# Patient Record
Sex: Male | Born: 2000
Health system: Southern US, Community
[De-identification: ages and names within clinical notes are randomized; demographics above are authoritative.]

## PROBLEM LIST (undated history)

## (undated) DIAGNOSIS — I498 Other specified cardiac arrhythmias: Secondary | ICD-10-CM

## (undated) HISTORY — DX: Other specified cardiac arrhythmias: I49.8

---
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
## 2021-02-21 ENCOUNTER — Encounter: Payer: Self-pay | Admitting: Emergency Medicine

## 2021-02-21 ENCOUNTER — Ambulatory Visit
Admission: EM | Admit: 2021-02-21 | Discharge: 2021-02-21 | Disposition: A | Discharge status: Home / Self Care | Payer: 59 | Attending: Sports Medicine | Admitting: Sports Medicine

## 2021-02-21 ENCOUNTER — Other Ambulatory Visit: Payer: Self-pay

## 2021-02-21 DIAGNOSIS — J029 Acute pharyngitis, unspecified: Secondary | ICD-10-CM | POA: Diagnosis not present

## 2021-02-21 DIAGNOSIS — K120 Recurrent oral aphthae: Secondary | ICD-10-CM | POA: Diagnosis not present

## 2021-02-21 LAB — GROUP A STREP BY PCR: Group A Strep by PCR: NOT DETECTED

## 2021-02-21 NOTE — Discharge Instructions (Signed)
Your strep test was negative. Your exam is consistent with an oral ulcer which should resolve in 1 to 2 weeks with just supportive care.  Please see the educational handout. If it does not resolve then you can see your primary care provider or come back here for consideration of medication.  Symptoms only been present 2 to 3 days a prescription medication is not indicated. Just continue with over-the-counter meds as needed.  Please read the educational handout. I gave you a work note saying you were seen today and go back to work tomorrow.

## 2021-02-21 NOTE — ED Provider Notes (Signed)
[facility]    CSN: [id number] Arrival date & time: 02/21/21  1316      History   Chief Complaint Chief Complaint  Patient presents with  . Sore Throat    HPI Duane Harris is a 20 y.o. male.   Patient pleasant 20 year old male who presents for evaluation the above issues.  He reports 2 to 3 days of a sore throat on the left side.  He is looked in the mirror and noted a white blister like appearance.  He has pain with swallowing.  He also has a mild headache.  He has been doing some salt water gargles but no over-the-counter meds, no throat lozenges.  Denies any URI symptoms.  No cough chest pain shortness of breath.  No abdominal or urinary symptoms.  He has not been vaccinated against COVID or influenza.  No COVID exposure or COVID history.  No red flag signs or symptoms appreciated on history.     History reviewed. No pertinent past medical history.  There are no problems to display for this patient.   History reviewed. No pertinent surgical history.     Home Medications    Prior to Admission medications   Medication Sig Start Date End Date Taking? Authorizing Provider  cyclobenzaprine (FLEXERIL) 5 MG tablet 1-2 tabs po q 8 hours prn 08/02/17   Payton Mccallum, MD  HYDROcodone-acetaminophen (NORCO/VICODIN) 5-325 MG tablet 1-2 tabs po bid prn 08/02/17   Payton Mccallum, MD    Family History History reviewed. No pertinent family history.  Social History Social History   Tobacco Use  . Smoking status: Never Smoker  . Smokeless tobacco: Never Used  Substance Use Topics  . Alcohol use: No  . Drug use: No     Allergies   Patient has no known allergies.   Review of Systems Review of Systems  Constitutional: Negative.  Negative for activity change, appetite change, chills, diaphoresis, fatigue and fever.  HENT: Positive for sore throat. Negative for congestion, ear discharge, ear pain, postnasal drip, rhinorrhea, sinus pressure and sinus pain.    Eyes: Negative.   Respiratory: Negative.  Negative for cough, chest tightness, shortness of breath, wheezing and stridor.   Cardiovascular: Negative.  Negative for chest pain and palpitations.  Gastrointestinal: Negative.  Negative for abdominal pain, constipation, diarrhea, nausea and vomiting.  Genitourinary: Negative.  Negative for dysuria.  Musculoskeletal: Negative.   Skin: Negative.  Negative for rash.  Neurological: Positive for headaches. Negative for dizziness, syncope, light-headedness and numbness.     Physical Exam Triage Vital Signs ED Triage Vitals  Enc Vitals Group     BP 02/21/21 1329 121/76     Pulse Rate 02/21/21 1329 78     Resp 02/21/21 1329 18     Temp 02/21/21 1329 98.2 F (36.8 C)     Temp Source 02/21/21 1329 Oral     SpO2 02/21/21 1329 100 %     Weight 02/21/21 1327 140 lb (63.5 kg)     Height 02/21/21 1327 6\' 1"  (1.854 m)     Head Circumference --      Peak Flow --      Pain Score 02/21/21 1327 4     Pain Loc --      Pain Edu? --      Excl. in GC? --    No data found.  Updated Vital Signs BP 121/76 (BP Location: Left Arm)   Pulse 78   Temp 98.2 F (36.8 C) (Oral)  Resp 18   Ht 6\' 1"  (1.854 m)   Wt 63.5 kg   SpO2 100%   BMI 18.47 kg/m   Visual Acuity Right Eye Distance:   Left Eye Distance:   Bilateral Distance:    Right Eye Near:   Left Eye Near:    Bilateral Near:     Physical Exam Vitals and nursing note reviewed.  Constitutional:      General: He is not in acute distress.    Appearance: He is well-developed. He is not ill-appearing, toxic-appearing or diaphoretic.  HENT:     Head: Normocephalic and atraumatic.     Right Ear: Tympanic membrane normal.     Left Ear: Tympanic membrane normal.     Nose: No congestion or rhinorrhea.     Mouth/Throat:     Mouth: Mucous membranes are moist. Oral lesions present.     Pharynx: Uvula midline. No pharyngeal swelling, oropharyngeal exudate, posterior oropharyngeal erythema or uvula  swelling.     Tonsils: No tonsillar exudate or tonsillar abscesses. 1+ on the right. 1+ on the left.  Eyes:     Extraocular Movements:     Right eye: Normal extraocular motion.     Left eye: Normal extraocular motion.     Conjunctiva/sclera: Conjunctivae normal.     Pupils: Pupils are equal, round, and reactive to light.  Neck:     Thyroid: No thyromegaly.  Cardiovascular:     Rate and Rhythm: Normal rate and regular rhythm.     Heart sounds: Normal heart sounds. No murmur heard. No friction rub. No gallop.   Pulmonary:     Effort: Pulmonary effort is normal. No respiratory distress.     Breath sounds: Normal breath sounds. No stridor. No wheezing, rhonchi or rales.  Abdominal:     General: Bowel sounds are normal.     Palpations: Abdomen is soft.     Tenderness: There is no abdominal tenderness. There is no guarding or rebound.  Musculoskeletal:     Cervical back: Normal range of motion and neck supple.  Lymphadenopathy:     Cervical: Cervical adenopathy present.  Skin:    General: Skin is warm and dry.     Capillary Refill: Capillary refill takes less than 2 seconds.     Coloration: Skin is not pale.     Findings: No erythema or rash.  Neurological:     General: No focal deficit present.     Mental Status: He is alert and oriented to person, place, and time.      UC Treatments / Results  Labs (all labs ordered are listed, but only abnormal results are displayed) Labs Reviewed  GROUP A STREP BY PCR    EKG   Radiology No results found.  Procedures Procedures (including critical care time)  Medications Ordered in UC Medications - No data to display  Initial Impression / Assessment and Plan / UC Course  I have reviewed the triage vital signs and the nursing notes.  Pertinent labs & imaging results that were available during my care of the patient were reviewed by me and considered in my medical decision making (see chart for details).  Clinical  impression: 2 to 3 days of left-sided sore throat with a white ulceration in the posterior oropharynx.  Treatment plan: 1.  The findings and treatment plan were discussed in detail with the patient.  Patient was in agreement. 2.  We will go ahead and get a strep test.  It was negative.  His exam is consistent with oral ulcer also called aphthous stomatitis. 3.  Educational handout provided. 4.  Over-the-counter meds as needed. 5.  Salt water gargles and throat lozenges. 6.  Get him a work note just saying he was seen today. 7.  Follow-up with pediatrician if symptoms do not resolve in 1 to 2 weeks for consideration of antiviral, antifungal, or antibiotic.  For now no prescription medication is indicated.    Final Clinical Impressions(s) / UC Diagnoses   Final diagnoses:  Aphthous stomatitis  Sore throat     Discharge Instructions     Your strep test was negative. Your exam is consistent with an oral ulcer which should resolve in 1 to 2 weeks with just supportive care.  Please see the educational handout. If it does not resolve then you can see your primary care provider or come back here for consideration of medication.  Symptoms only been present 2 to 3 days a prescription medication is not indicated. Just continue with over-the-counter meds as needed.  Please read the educational handout. I gave you a work note saying you were seen today and go back to work tomorrow.    ED Prescriptions    None     PDMP not reviewed this encounter.   Delton See, MD 02/21/21 1410

## 2021-02-21 NOTE — ED Triage Notes (Signed)
Patient c/o sore throat and a blister in his throat x 3 days. Patient also c/o headache.

## 2021-07-09 ENCOUNTER — Other Ambulatory Visit: Payer: Self-pay

## 2021-07-09 ENCOUNTER — Ambulatory Visit: Payer: Self-pay | Admitting: Physician Assistant

## 2021-07-09 ENCOUNTER — Encounter: Payer: Self-pay | Admitting: Physician Assistant

## 2021-07-09 VITALS — BP 134/77 | Temp 98.6°F | Resp 14 | Ht 73.0 in | Wt 145.0 lb

## 2021-07-09 DIAGNOSIS — B360 Pityriasis versicolor: Secondary | ICD-10-CM

## 2021-07-09 MED ORDER — KETOCONAZOLE 2 % EX CREA: 1.0000 "application " | TOPICAL_CREAM | Freq: Every day | CUTANEOUS | 1 refills | Status: DC

## 2021-07-09 NOTE — Progress Notes (Signed)
Pt has rash from bottom of back coming up to the sholders. Pt states when he gets hot it gets worse. States that it itches. Started almost 2 months ago.

## 2021-07-09 NOTE — Progress Notes (Signed)
   Subjective: Rash    Patient ID: Duane Harris, male    DOB: [birth year], 20 y.o.   MRN: [id number]  HPI Patient presents with a rash to his back for over 2 weeks.  Patient states no itching associated with rash.  Review of Systems Negative except for complaint     Objective:    Hypopigmented macular lesions on the back.     Assessment & Plan: Tinea versicolor   Discussed treatment option with patient who was amenable to topical Ketoconazole application trial for 2 to 3 weeks.  Advised if no resolution will consider oral medication after liver function test.  Patient will follow-up in 2 weeks.  Advised to take picture of rash today for comparison at next visit.

## 2023-06-19 ENCOUNTER — Ambulatory Visit
Admission: RE | Admit: 2023-06-19 | Discharge: 2023-06-19 | Disposition: A | Discharge status: Home / Self Care | Payer: 59 | Source: Ambulatory Visit | Attending: Emergency Medicine | Admitting: Emergency Medicine

## 2023-06-19 VITALS — BP 129/78 | HR 59 | Temp 99.1°F | Resp 18

## 2023-06-19 DIAGNOSIS — Z23 Encounter for immunization: Secondary | ICD-10-CM

## 2023-06-19 DIAGNOSIS — S81832A Puncture wound without foreign body, left lower leg, initial encounter: Secondary | ICD-10-CM

## 2023-06-19 MED ORDER — TETANUS-DIPHTH-ACELL PERTUSSIS 5-2.5-18.5 LF-MCG/0.5 IM SUSY
0.5000 mL | PREFILLED_SYRINGE | Freq: Once | INTRAMUSCULAR | Status: AC
  Administered 2023-06-19: 0.5 mL via INTRAMUSCULAR

## 2023-06-19 MED ORDER — SULFAMETHOXAZOLE-TRIMETHOPRIM 800-160 MG PO TABS: 1.0000 | ORAL_TABLET | Freq: Two times a day (BID) | ORAL | 0 refills | Status: AC

## 2023-06-19 NOTE — Discharge Instructions (Signed)
You received a Tdap booster today.  This vaccine is used to prevent disease caused by tetanus, diphtheria and pertussis and is given typically every 10 years, every 5 years if you have been injured.  If you  would like more information about this vaccine, please visit the CDC website, PicCapture.uy.  Please keep your wound clean and dry and continue to monitor it for signs of worsening infection which include pain, redness, drainage, swelling.  You are very smart to put a mark around your current wound and IM glad that it is already getting better.  I do believe that antibiotics are still needed because you are having pain in your heel which means the infection has gone deep.  Please read below to learn more about the medications, dosages and frequencies that I recommend to help alleviate your symptoms and to get you feeling better soon:   Bactrim (trimethoprim sulfamethoxazole): To treat the infection in your left lower leg and heel, please take one (1) dose twice daily for 5 days.  This medication can cause upset stomach but please know that this will resolve once you have completed the antibiotics.  Please avoid taking this food with dairy products.   Advil, Motrin (ibuprofen): This is a good anti-inflammatory medication which addresses aches, pains and inflammation of the upper airways that causes sinus and nasal congestion as well as in the lower airways which makes your cough feel tight and sometimes burn.  I recommend that you take between 400 to 600 mg every 6-8 hours as needed.  Please do not take more than 2400 mg of ibuprofen in a 24-hour period and please do not take high doses of ibuprofen for more than 3 days in a row as this can lead to stomach ulcers.    If symptoms have not meaningfully improved in the next 3 to 5 days, please return for repeat evaluation or follow-up with your regular provider.  If symptoms have worsened in the next 3 to 5 days, please return for repeat  evaluation or follow-up with your regular provider.    Thank you for visiting urgent care today.  We appreciate the opportunity to participate in your care.

## 2023-06-19 NOTE — ED Triage Notes (Signed)
Pt reports noting puncture wounds to Left lateral ankle on Tuesday. States the area became red, swollen, and hot to the touch. Has surrounding area marked with pen; has lessened from initial marking. Reports pain down achilles with ambulation. Has been treating with peroxide. [place]

## 2023-06-19 NOTE — ED Provider Notes (Signed)
[facility]    CSN: [id number] Arrival date & time: 06/19/23  0820    HISTORY   Chief Complaint  Patient presents with   Puncture Wound    Noticed a puncture wound on lower left leg, above the ankle on Tuesday. Redness has grown with additional swelling and tenderness. Not sure what caused it, possible snake bite. (has two small holes) - Entered by patient   HPI Duane [facility] is a pleasant, 22 y.o. male who presents to urgent care today. Pt reports noticing 2 puncture wounds about a centimeter apart on his left lateral ankle 2 days ago. States he noticed the area because it is red, swollen, and hot to the touch.  Patient wisely drew a circle around the area with a pen and states that the area of redness has lessened within the line.  Patient states last night he noticed that when he is walking he has significant pain on the back of his heel and bottom of his heel of his left foot.  Patient states he has been cleaning the area with peroxide.  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  Patient states he also went rafting this weekend on a river in [place].  Patient states he is not sure how he obtained the wound.  The history is provided by the patient.   Past Medical History:  Diagnosis Date   Sinus arrhythmia    There are no problems to display for this patient.  History reviewed. No pertinent surgical history.  Home Medications    Prior to Admission medications   Medication Sig Start Date End Date Taking? Authorizing Provider  ketoconazole (NIZORAL) 2 % cream Apply 1 application topically daily. 07/09/21   Joni Reining, PA-C    Family History No family history on file. Social History Social History   Tobacco Use   Smoking status: Never   Smokeless tobacco: Never  Vaping Use   Vaping status: Never Used  Substance Use Topics   Alcohol use: No   Drug use: No   Allergies   Patient has no known  allergies.  Review of Systems Review of Systems Pertinent findings revealed after performing a 14 point review of systems has been noted in the history of present illness.  Physical Exam Vital Signs BP 129/78 (BP Location: Right Arm)   Pulse (!) 59   Temp 99.1 F (37.3 C) (Oral)   Resp 18   SpO2 97%   No data found.  Physical Exam Vitals and nursing note reviewed.  Constitutional:      General: He is not in acute distress.    Appearance: Normal appearance. He is normal weight. He is not ill-appearing.  HENT:     Head: Normocephalic and atraumatic.  Eyes:     Extraocular Movements: Extraocular movements intact.     Conjunctiva/sclera: Conjunctivae normal.     Pupils: Pupils are equal, round, and reactive to light.  Cardiovascular:     Rate and Rhythm: Normal rate and regular rhythm.  Pulmonary:     Effort: Pulmonary effort is normal.     Breath sounds: Normal breath sounds.  Musculoskeletal:        General: Normal range of motion.     Cervical back: Normal range of motion and neck supple.  Skin:    General: Skin is warm and dry.  Neurological:     General: No focal deficit present.     Mental  Status: He is alert and oriented to person, place, and time. Mental status is at baseline.  Psychiatric:        Mood and Affect: Mood normal.        Behavior: Behavior normal.        Thought Content: Thought content normal.        Judgment: Judgment normal.     Visual Acuity Right Eye Distance:   Left Eye Distance:   Bilateral Distance:    Right Eye Near:   Left Eye Near:    Bilateral Near:     UC Couse / Diagnostics / Procedures:     Radiology No results found.  Procedures Procedures (including critical care time) EKG  Pending results:  Labs Reviewed - No data to display  Medications Ordered in UC: Medications  Tdap (BOOSTRIX) injection 0.5 mL (0.5 mLs Intramuscular Given 06/19/23 0859)    UC Diagnoses / Final Clinical Impressions(s)   I have reviewed  the triage vital signs and the nursing notes.  Pertinent labs & imaging results that were available during my care of the patient were reviewed by me and considered in my medical decision making (see chart for details).    Final diagnoses:  Puncture wound of left lower leg, initial encounter  Need for prophylactic vaccination with combined diphtheria-tetanus-pertussis (DTP) vaccine   Patient provided with a Tdap booster during his visit today.  Patient instructed to begin Bactrim double strength, 1 tablet twice daily for the next 5 days, continue to keep his wound clean and dry and continue to monitor signs for worsening infection, return if wound is getting worse and not better.  Please see discharge instructions below for details of plan of care as provided to patient. ED Prescriptions     Medication Sig Dispense Auth. Provider   sulfamethoxazole-trimethoprim (BACTRIM DS) 800-160 MG tablet Take 1 tablet by mouth 2 (two) times daily for 5 days. 10 tablet Theadora Rama Scales, PA-C      PDMP not reviewed this encounter.  Pending results:  Labs Reviewed - No data to display  Discharge Instructions:   Discharge Instructions      You received a Tdap booster today.  This vaccine is used to prevent disease caused by tetanus, diphtheria and pertussis and is given typically every 10 years, every 5 years if you have been injured.  If you  would like more information about this vaccine, please visit the CDC website, PicCapture.uy.  Please keep your wound clean and dry and continue to monitor it for signs of worsening infection which include pain, redness, drainage, swelling.  You are very smart to put a mark around your current wound and IM glad that it is already getting better.  I do believe that antibiotics are still needed because you are having pain in your heel which means the infection has gone deep.  Please read below to learn more about the medications, dosages and  frequencies that I recommend to help alleviate your symptoms and to get you feeling better soon:   Bactrim (trimethoprim sulfamethoxazole): To treat the infection in your left lower leg and heel, please take one (1) dose twice daily for 5 days.  This medication can cause upset stomach but please know that this will resolve once you have completed the antibiotics.  Please avoid taking this food with dairy products.   Advil, Motrin (ibuprofen): This is a good anti-inflammatory medication which addresses aches, pains and inflammation of the upper airways that causes sinus and nasal  congestion as well as in the lower airways which makes your cough feel tight and sometimes burn.  I recommend that you take between 400 to 600 mg every 6-8 hours as needed.  Please do not take more than 2400 mg of ibuprofen in a 24-hour period and please do not take high doses of ibuprofen for more than 3 days in a row as this can lead to stomach ulcers.    If symptoms have not meaningfully improved in the next 3 to 5 days, please return for repeat evaluation or follow-up with your regular provider.  If symptoms have worsened in the next 3 to 5 days, please return for repeat evaluation or follow-up with your regular provider.    Thank you for visiting urgent care today.  We appreciate the opportunity to participate in your care.       Disposition Upon Discharge:  Condition: stable for discharge home  Patient presented with an acute illness with associated systemic symptoms and significant discomfort requiring urgent management. In my opinion, this is a condition that a prudent lay person (someone who possesses an average knowledge of health and medicine) may potentially expect to result in complications if not addressed urgently such as respiratory distress, impairment of bodily function or dysfunction of bodily organs.   Routine symptom specific, illness specific and/or disease specific instructions were discussed with  the patient and/or caregiver at length.   As such, the patient has been evaluated and assessed, work-up was performed and treatment was provided in alignment with urgent care protocols and evidence based medicine.  Patient/parent/caregiver has been advised that the patient may require follow up for further testing and treatment if the symptoms continue in spite of treatment, as clinically indicated and appropriate.  Patient/parent/caregiver has been advised to return to the [facility] or PCP if no better; to PCP or the Emergency Department if new signs and symptoms develop, or if the current signs or symptoms continue to change or worsen for further workup, evaluation and treatment as clinically indicated and appropriate  The patient will follow up with their current PCP if and as advised. If the patient does not currently have a PCP we will assist them in obtaining one.   The patient may need specialty follow up if the symptoms continue, in spite of conservative treatment and management, for further workup, evaluation, consultation and treatment as clinically indicated and appropriate.  Patient/parent/caregiver verbalized understanding and agreement of plan as discussed.  All questions were addressed during visit.  Please see discharge instructions below for further details of plan.  This office note has been dictated using Teaching laboratory technician.  Unfortunately, this method of dictation can sometimes lead to typographical or grammatical errors.  I apologize for your inconvenience in advance if this occurs.  Please do not hesitate to reach out to me if clarification is needed.      Theadora Rama Scales, [place] 06/19/23 [phone]

## 2023-07-08 ENCOUNTER — Ambulatory Visit: Payer: Self-pay | Admitting: Physician Assistant

## 2023-07-08 ENCOUNTER — Encounter: Payer: Self-pay | Admitting: Physician Assistant

## 2023-07-08 VITALS — BP 129/84 | HR 66 | Temp 99.1°F | Resp 12 | Ht 73.0 in | Wt 143.0 lb

## 2023-07-08 DIAGNOSIS — Z0000 Encounter for general adult medical examination without abnormal findings: Secondary | ICD-10-CM

## 2023-07-08 DIAGNOSIS — T63441A Toxic effect of venom of bees, accidental (unintentional), initial encounter: Secondary | ICD-10-CM

## 2023-07-08 LAB — POCT URINALYSIS DIPSTICK
Bilirubin, UA: NEGATIVE
Blood, UA: NEGATIVE
Glucose, UA: NEGATIVE
Ketones, UA: NEGATIVE
Leukocytes, UA: NEGATIVE
Nitrite, UA: NEGATIVE
Protein, UA: NEGATIVE
Spec Grav, UA: 1.015 (ref 1.010–1.025)
Urobilinogen, UA: 0.2 E.U./dL
pH, UA: 7 (ref 5.0–8.0)

## 2023-07-08 MED ORDER — EPINEPHRINE 0.3 MG/0.3ML IJ SOAJ: 0.3000 mg | Freq: Once | INTRAMUSCULAR | Status: AC

## 2023-07-08 NOTE — Progress Notes (Signed)
Yellow jacket stung Rt Hand on 07/20/2024started out itching then HIVES started on arm pits, chest, back, waist band & face. Nose & lips swelled up No respiratory issues States has been stung by bees & yellow jackets growing up & never had a reaction like this - 1st time having a reaction  Here today to establish care - previously seen ab [facility]  Requesting an Epipen  AMd

## 2023-07-08 NOTE — Progress Notes (Signed)
[facility] ____________________________________________   None    (approximate)  I have reviewed the triage vital signs and the nursing notes.   HISTORY  Chief Complaint Medication Refill (Requesting Rx EpiPen) and Annual Exam (Nurse Physical - Establish Care - Dependant of Anthonymichael Mcgary)   HPI [facility] is a 22 y.o. male patient presents for annual physical exam.  Patient requests EpiPen.         Past Medical History:  Diagnosis Date   Sinus arrhythmia     There are no problems to display for this patient.   History reviewed. No pertinent surgical history.  Prior to Admission medications   Not on File    Allergies Yellow jacket venom  History reviewed. No pertinent family history.  Social History Social History   Tobacco Use   Smoking status: Never   Smokeless tobacco: Current   Tobacco comments:    Zyn pouch - Does have Nicotine  Vaping Use   Vaping status: Never Used  Substance Use Topics   Alcohol use: No   Drug use: No    Review of Systems Constitutional: No fever/chills Eyes: No visual changes. ENT: No sore throat. Cardiovascular: Denies chest pain. Respiratory: Denies shortness of breath. Gastrointestinal: No abdominal pain.  No nausea, no vomiting.  No diarrhea.  No constipation. Genitourinary: Negative for dysuria. Musculoskeletal: Negative for back pain. Skin: Negative for rash. Neurological: Negative for headaches, focal weakness or numbness. Hematological/Lymphatic:  Allergic/Immunilogical: Bee/Wasps ____________________________________________   PHYSICAL EXAM:  VITAL SIGNS: BP 129/84  BP Location Left Arm  Patient Position Sitting  Cuff Size Normal  Pulse 66  Resp 12  Temp 99.1 F (37.3 C)  Temp src Temporal  SpO2 100 %  Weight 143 lb (64.9 kg)  Height 6\' 1"  (1.854 m)   BMI 18.87 kg/m2  BSA 1.83 m2   Constitutional: Alert and oriented. Well appearing and in no acute  distress. Eyes: Conjunctivae are normal. PERRL. EOMI. Head: Atraumatic. Nose: No congestion/rhinnorhea. Mouth/Throat: Mucous membranes are moist.  Oropharynx non-erythematous. Neck: No stridor.  No cervical spine tenderness to palpation. Hematological/Lymphatic/Immunilogical: No cervical lymphadenopathy. Cardiovascular: Normal rate, regular rhythm. Grossly normal heart sounds.  Good peripheral circulation. Respiratory: Normal respiratory effort.  No retractions. Lungs CTAB. Gastrointestinal: Soft and nontender. No distention. No abdominal bruits. No CVA tenderness. Genitourinary: Deferred Musculoskeletal: No lower extremity tenderness nor edema.  No joint effusions. Neurologic:  Normal speech and language. No gross focal neurologic deficits are appreciated. No gait instability. Skin:  Skin is warm, dry and intact. No rash noted. Psychiatric: Mood and affect are normal. Speech and behavior are normal.  ____________________________________________   LABS Pending ____________________________________________    ____________________________________________   INITIAL IMPRESSION / ASSESSMENT AND PLAN   As part of my medical decision making, I reviewed the following data within the electronic MEDICAL RECORD NUMBER      No acute findings on physical exam.  Labs pending.        ____________________________________________   FINAL CLINICAL IMPRESSION  Well exam   ED Discharge Orders          Ordered    EPINEPHRINE 0.3 MG/0.3ML IJ SOAJ   Once        07/08/23 1546    CMP12+LP+TP+TSH+6AC+CBC/D/Plt        07/08/23 1507    POCT urinalysis dipstick        07/08/23 1507             Note:  This document  was prepared using Conservation officer, historic buildings and may include unintentional dictation errors.

## 2023-07-10 ENCOUNTER — Other Ambulatory Visit: Payer: Self-pay

## 2023-07-10 MED ORDER — EPINEPHRINE 0.3 MG/0.3ML IJ SOSY: 0.3000 mg | PREFILLED_SYRINGE | Freq: Once | INTRAMUSCULAR | 1 refills | Status: AC | PRN
